# Patient Record
Sex: Female | Born: 1980 | Race: White | Hispanic: No | Marital: Single | Smoking: Current every day smoker
Health system: Southern US, Community
[De-identification: ages and names within clinical notes are randomized; demographics above are authoritative.]

## PROBLEM LIST (undated history)

## (undated) DIAGNOSIS — S82899A Other fracture of unspecified lower leg, initial encounter for closed fracture: Secondary | ICD-10-CM

## (undated) DIAGNOSIS — G43909 Migraine, unspecified, not intractable, without status migrainosus: Secondary | ICD-10-CM

## (undated) HISTORY — PX: ANKLE SURGERY: SHX546

## (undated) HISTORY — PX: MYRINGOTOMY: SUR874

## (undated) HISTORY — PX: WRIST SURGERY: SHX841

## (undated) HISTORY — PX: FRACTURE SURGERY: SHX138

## (undated) HISTORY — PX: CHOLECYSTECTOMY: SHX55

## (undated) HISTORY — PX: TUBAL LIGATION: SHX77

---

## 2003-02-19 ENCOUNTER — Ambulatory Visit (HOSPITAL_COMMUNITY): Admission: RE | Admit: 2003-02-19 | Discharge: 2003-02-19 | Payer: Self-pay | Admitting: *Deleted

## 2003-02-19 ENCOUNTER — Encounter: Payer: Self-pay | Admitting: *Deleted

## 2003-03-19 ENCOUNTER — Ambulatory Visit (HOSPITAL_COMMUNITY): Admission: RE | Admit: 2003-03-19 | Discharge: 2003-03-19 | Payer: Self-pay | Admitting: *Deleted

## 2003-03-19 ENCOUNTER — Encounter: Payer: Self-pay | Admitting: *Deleted

## 2003-05-05 ENCOUNTER — Encounter: Payer: Self-pay | Admitting: *Deleted

## 2003-05-05 ENCOUNTER — Ambulatory Visit (HOSPITAL_COMMUNITY): Admission: RE | Admit: 2003-05-05 | Discharge: 2003-05-05 | Payer: Self-pay | Admitting: *Deleted

## 2003-05-22 ENCOUNTER — Ambulatory Visit (HOSPITAL_COMMUNITY): Admission: AD | Admit: 2003-05-22 | Discharge: 2003-05-22 | Payer: Self-pay | Admitting: *Deleted

## 2003-05-26 ENCOUNTER — Ambulatory Visit (HOSPITAL_COMMUNITY): Admission: AD | Admit: 2003-05-26 | Discharge: 2003-05-26 | Payer: Self-pay | Admitting: *Deleted

## 2003-05-29 ENCOUNTER — Ambulatory Visit (HOSPITAL_COMMUNITY): Admission: AD | Admit: 2003-05-29 | Discharge: 2003-05-30 | Payer: Self-pay | Admitting: *Deleted

## 2003-05-29 ENCOUNTER — Ambulatory Visit (HOSPITAL_COMMUNITY): Admission: AD | Admit: 2003-05-29 | Discharge: 2003-05-29 | Payer: Self-pay | Admitting: *Deleted

## 2003-06-02 ENCOUNTER — Ambulatory Visit (HOSPITAL_COMMUNITY): Admission: AD | Admit: 2003-06-02 | Discharge: 2003-06-02 | Payer: Self-pay | Admitting: *Deleted

## 2003-06-05 ENCOUNTER — Ambulatory Visit (HOSPITAL_COMMUNITY): Admission: AD | Admit: 2003-06-05 | Discharge: 2003-06-05 | Payer: Self-pay | Admitting: *Deleted

## 2003-06-12 ENCOUNTER — Ambulatory Visit (HOSPITAL_COMMUNITY): Admission: AD | Admit: 2003-06-12 | Discharge: 2003-06-12 | Payer: Self-pay | Admitting: *Deleted

## 2003-06-16 ENCOUNTER — Ambulatory Visit (HOSPITAL_COMMUNITY): Admission: RE | Admit: 2003-06-16 | Discharge: 2003-06-16 | Payer: Self-pay | Admitting: *Deleted

## 2003-06-16 ENCOUNTER — Encounter: Payer: Self-pay | Admitting: *Deleted

## 2003-06-16 ENCOUNTER — Ambulatory Visit (HOSPITAL_COMMUNITY): Admission: AD | Admit: 2003-06-16 | Discharge: 2003-06-16 | Payer: Self-pay | Admitting: *Deleted

## 2003-06-19 ENCOUNTER — Ambulatory Visit (HOSPITAL_COMMUNITY): Admission: AD | Admit: 2003-06-19 | Discharge: 2003-06-19 | Payer: Self-pay | Admitting: *Deleted

## 2003-06-20 ENCOUNTER — Inpatient Hospital Stay (HOSPITAL_COMMUNITY): Admission: AD | Admit: 2003-06-20 | Discharge: 2003-06-23 | Payer: Self-pay | Admitting: *Deleted

## 2005-03-02 ENCOUNTER — Ambulatory Visit (HOSPITAL_COMMUNITY): Admission: RE | Admit: 2005-03-02 | Discharge: 2005-03-02 | Payer: Self-pay | Admitting: *Deleted

## 2005-05-11 ENCOUNTER — Ambulatory Visit (HOSPITAL_COMMUNITY): Admission: AD | Admit: 2005-05-11 | Discharge: 2005-05-11 | Payer: Self-pay | Admitting: *Deleted

## 2005-07-10 ENCOUNTER — Inpatient Hospital Stay (HOSPITAL_COMMUNITY): Admission: AD | Admit: 2005-07-10 | Discharge: 2005-07-12 | Payer: Self-pay | Admitting: *Deleted

## 2005-08-04 ENCOUNTER — Ambulatory Visit (HOSPITAL_COMMUNITY): Admission: RE | Admit: 2005-08-04 | Discharge: 2005-08-04 | Payer: Self-pay | Admitting: *Deleted

## 2010-12-30 ENCOUNTER — Emergency Department (HOSPITAL_COMMUNITY): Payer: Medicaid Other

## 2010-12-30 ENCOUNTER — Emergency Department (HOSPITAL_COMMUNITY)
Admission: EM | Admit: 2010-12-30 | Discharge: 2010-12-31 | Disposition: A | Discharge status: Home / Self Care | Payer: Medicaid Other | Attending: Emergency Medicine | Admitting: Emergency Medicine

## 2010-12-30 DIAGNOSIS — G8918 Other acute postprocedural pain: Secondary | ICD-10-CM | POA: Insufficient documentation

## 2011-09-01 ENCOUNTER — Encounter: Payer: Self-pay | Admitting: Oncology

## 2011-09-01 ENCOUNTER — Emergency Department (HOSPITAL_COMMUNITY)
Admission: EM | Admit: 2011-09-01 | Discharge: 2011-09-01 | Disposition: A | Discharge status: Home / Self Care | Payer: Medicaid Other | Attending: Emergency Medicine | Admitting: Emergency Medicine

## 2011-09-01 DIAGNOSIS — F17200 Nicotine dependence, unspecified, uncomplicated: Secondary | ICD-10-CM | POA: Insufficient documentation

## 2011-09-01 DIAGNOSIS — M545 Low back pain, unspecified: Secondary | ICD-10-CM | POA: Insufficient documentation

## 2011-09-01 HISTORY — DX: Other fracture of unspecified lower leg, initial encounter for closed fracture: S82.899A

## 2011-09-01 MED ORDER — HYDROMORPHONE HCL 2 MG/ML IJ SOLN
2.0000 mg | Freq: Once | INTRAMUSCULAR | Status: AC
  Administered 2011-09-01: 2 mg via INTRAMUSCULAR
  Filled 2011-09-01: qty 1

## 2011-09-01 MED ORDER — OXYCODONE-ACETAMINOPHEN 5-325 MG PO TABS: 2.0000 | ORAL_TABLET | ORAL | Status: AC | PRN

## 2011-09-01 NOTE — ED Notes (Signed)
MD at bedside. 

## 2011-09-01 NOTE — ED Provider Notes (Signed)
History     CSN: [id number] Arrival date & time: 09/01/2011  7:25 PM  Chief Complaint  Patient presents with  . Back Pain    (Consider location/radiation/quality/duration/timing/severity/associated sxs/prior treatment) HPI This 30 year old female has the gradual onset of constant lumbar pain without radiation or associated symptoms. Her pain is severe and burning. She has no pain down her legs with no weakness or numbness of her legs except for some chronic left foot weakness which has not changed for years. She has no change in bowel or bladder function. There is no trauma. She has no fever chest pain cough or shortness of breath. She has no abdominal pain or nausea or vomiting. She denies IV drug abuse. She has tried Tylenol and Motrin without relief at home. Any movement makes the pain a lot worse it's much better she states still. Past Medical History  Diagnosis Date  . Ankle fracture     Left.    Past Surgical History  Procedure Date  . Tubal ligation   . Cholecystectomy   . Myringotomy   . Ankle surgery     No family history on file.  History  Substance Use Topics  . Smoking status: Current Everyday Smoker  . Smokeless tobacco: Not on file  . Alcohol Use: No    OB History    Grav Para Term Preterm Abortions TAB SAB Ect Mult Living                  Review of Systems  Constitutional: Negative for fever.       10 Systems reviewed and are negative for acute change except as noted in the HPI.  HENT: Negative for congestion.   Eyes: Negative for discharge and redness.  Respiratory: Negative for cough and shortness of breath.   Cardiovascular: Negative for chest pain.  Gastrointestinal: Negative for vomiting, abdominal pain and diarrhea.  Genitourinary: Negative for dysuria, vaginal bleeding and vaginal discharge.  Musculoskeletal: Positive for back pain.  Skin: Negative for rash.  Neurological: Negative for syncope, numbness and headaches.    Psychiatric/Behavioral:       No behavior change.    Allergies  Review of patient's allergies indicates no known allergies.  Home Medications   Current Outpatient Rx  Name Route Sig Dispense Refill  . ALBUTEROL SULFATE HFA 108 (90 BASE) MCG/ACT IN AERS Inhalation Inhale 2 puffs into the lungs as needed. For acute asthma     . IBUPROFEN 200 MG PO TABS Oral Take 800 mg by mouth daily as needed. For pain     . OXYCODONE-ACETAMINOPHEN 5-325 MG PO TABS Oral Take 2 tablets by mouth every 4 (four) hours as needed for pain. 20 tablet 0    BP 136/94  Pulse 60  Temp(Src) 98.7 F (37.1 C) (Oral)  Resp 16  Ht 5\' 11"  (1.803 m)  Wt 195 lb (88.451 kg)  BMI 27.20 kg/m2  SpO2 100%  LMP 08/31/2011  Physical Exam  Nursing note and vitals reviewed. Constitutional:       Awake, alert, nontoxic appearance with baseline speech.  HENT:  Head: Atraumatic.  Eyes: Pupils are equal, round, and reactive to light. Right eye exhibits no discharge. Left eye exhibits no discharge.  Neck: Neck supple.  Cardiovascular: Normal rate and regular rhythm.   No murmur heard. Pulmonary/Chest: Effort normal and breath sounds normal. No respiratory distress. She has no wheezes. She has no rales. She exhibits no tenderness.  Abdominal: Soft. Bowel sounds are normal. She exhibits no mass.  There is no tenderness. There is no rebound.  Musculoskeletal: She exhibits no edema.       Thoracic back: She exhibits no tenderness.       Lumbar back: She exhibits no tenderness.       Bilateral lower extremities non tender without new rashes or color change, baseline ROM with intact DP pulses, CR<2 secs all digits bilaterally, sensation baseline light touch bilaterally for pt, DTR's symmetric and intact bilaterally KJ / AJ, motor symmetric bilateral 5 / 5 hip flexion, quadriceps, hamstrings  Right-sided EHL, foot dorsiflexion, foot plantarflexion is 5 out of 5 on the left side of these muscle groups is 4-5 at baseline and  unchanged today, gait is somewhat antalgic but without apparent new ataxia.  Back exam is diffusely tender over the midline lumbar and paralumbar regions without CVA tenderness  Neurological:       Mental status baseline for patient.  Upper extremity motor strength and sensation intact and symmetric bilaterally.  Skin: No rash noted.  Psychiatric: She has a normal mood and affect.    ED Course  Procedures (including critical care time)  Labs Reviewed - No data to display No results found.   1. Lumbar pain       MDM  I doubt any other EMC precluding discharge at this time including, but not necessarily limited to the following:cauda equina, SBI.        Hurman Horn, MD 09/05/11 2215

## 2011-09-01 NOTE — ED Notes (Signed)
Pt reports x 2 days ago she began with low back pain after waking; no known injury.  Pt denies radiation into LE.

## 2011-09-01 NOTE — ED Notes (Signed)
Lower back pain, pt denies injury. edp in room at this time.

## 2011-09-15 ENCOUNTER — Observation Stay (HOSPITAL_COMMUNITY)
Admission: EM | Admit: 2011-09-15 | Discharge: 2011-09-15 | DRG: 512 | Disposition: A | Discharge status: Home / Self Care | Payer: Medicaid Other | Attending: Orthopedic Surgery | Admitting: Orthopedic Surgery

## 2011-09-15 DIAGNOSIS — S52599A Other fractures of lower end of unspecified radius, initial encounter for closed fracture: Principal | ICD-10-CM | POA: Insufficient documentation

## 2011-09-15 DIAGNOSIS — G5600 Carpal tunnel syndrome, unspecified upper limb: Secondary | ICD-10-CM | POA: Insufficient documentation

## 2011-09-15 DIAGNOSIS — J45909 Unspecified asthma, uncomplicated: Secondary | ICD-10-CM | POA: Insufficient documentation

## 2011-09-15 DIAGNOSIS — F17200 Nicotine dependence, unspecified, uncomplicated: Secondary | ICD-10-CM | POA: Insufficient documentation
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
## 2011-09-21 NOTE — Op Note (Signed)
  [name], Nancy Mclean NO.:  [id number]  MEDICAL RECORD NO.:  [id number]  LOCATION:  2550                         FACILITY:  [facility]  PHYSICIAN:  Artist Pais. Tsuruko Murtha, M.D.DATE OF BIRTH:  Jun 13, 1981  DATE OF PROCEDURE:  09/15/2011 DATE OF DISCHARGE:  09/15/2011                              OPERATIVE REPORT   PREOPERATIVE DIAGNOSES: 1. Fracture dislocation, right wrist. 2. Right carpal tunnel syndrome.  POSTOPERATIVE DIAGNOSES: 1. Fracture dislocation, right wrist. 2. Right carpal tunnel syndrome.  PROCEDURE:  Open reduction and internal fixation of above with carpal tunnel release.  SURGEON:  Artist Pais. Mina Marble, MD  ASSISTANT:  None.  ANESTHESIA:  General.  TOURNIQUET TIME:  52 minutes.  COMPLICATIONS:  None.  DRAINS:  None.  The patient was taken to the operating suite after induction of general anesthesia.  Right upper extremity was prepped and draped in the usual sterile fashion.  An Esmarch was used to exsanguinate the limb. Tourniquet was inflated to 265 mmHg.  At this point in time, an incision was made starting at Kaplan's cardinal line going across the wrist crease in a Brunner fashion and extended out over the FCR to incorporate the carpal tunnel and the distal radius fracture.  Skin was incised. Dissection was carried down to skin and subcutaneous tissues.  The median nerve was identified at the proximal edge of the carpal tunnel. The carpal tunnel was released.  The median nerve was decompressed. There was significant blood and contusion about the median nerve.  It was decompressed proximally including release of the antecubital fascia. Once this was done, the interval between the FCR and radial artery was developed.  Dissection was carried down the level of pronator quadratus. There was a fracture dislocation of the wrist that was reduced with traction and flexion.  After this was done, the pronator quadratus was subperiosteally  stripped off the distal radius revealing a comminuted 4- 5 part intraarticular fracture of the distal radius.  One of the articular fragments had flipped 90 degrees.  This was carefully teased and eased back in position using pickups and a Therapist, nutritional.  Once this was done, a standard DVR plate was placed on the distal radius and using fluoroscopic guidance it was placed at its distal most extent to incorporate all the fracture fragments.  It was fixed with cortical screws proximally and smooth pegs distally.  Under again direct fluoroscopic guidance, intraoperative fluoroscopy revealed adequate reduction on AP, lateral, and oblique view.  After this was done, the wound was thoroughly irrigated.  The wound was closed in layers of 2-0 undyed Vicryl to reapproximate the pronator quadratus, 2-0 undyed Vicryl for the subcutaneous tissues, and 4-0 Vicryl Rapide on the skin.  Xeroform, 4 x 4's, fluffs, and a volar splint was applied.  The patient tolerated the procedure well and went to the recovery room in stable fashion.     Artist Pais Mina Marble, M.D.     MAW/MEDQ  D:  09/15/2011  T:  09/16/2011  Job:  [id number]  Electronically Signed by Dairl Ponder M.D. on 09/21/2011 04:27:12 PM

## 2012-08-27 ENCOUNTER — Emergency Department (HOSPITAL_COMMUNITY)
Admission: EM | Admit: 2012-08-27 | Discharge: 2012-08-27 | Disposition: A | Discharge status: Home / Self Care | Payer: Self-pay | Attending: Emergency Medicine | Admitting: Emergency Medicine

## 2012-08-27 ENCOUNTER — Encounter (HOSPITAL_COMMUNITY): Payer: Self-pay | Admitting: *Deleted

## 2012-08-27 DIAGNOSIS — R51 Headache: Secondary | ICD-10-CM | POA: Insufficient documentation

## 2012-08-27 DIAGNOSIS — F17200 Nicotine dependence, unspecified, uncomplicated: Secondary | ICD-10-CM | POA: Insufficient documentation

## 2012-08-27 HISTORY — DX: Migraine, unspecified, not intractable, without status migrainosus: G43.909

## 2012-08-27 MED ORDER — SODIUM CHLORIDE 0.9 % IV BOLUS (SEPSIS)
1000.0000 mL | Freq: Once | INTRAVENOUS | Status: AC
  Administered 2012-08-27: 1000 mL via INTRAVENOUS

## 2012-08-27 MED ORDER — METOCLOPRAMIDE HCL 5 MG/ML IJ SOLN
10.0000 mg | Freq: Once | INTRAMUSCULAR | Status: AC
  Administered 2012-08-27: 10 mg via INTRAVENOUS
  Filled 2012-08-27: qty 2

## 2012-08-27 MED ORDER — DIPHENHYDRAMINE HCL 50 MG/ML IJ SOLN
25.0000 mg | Freq: Once | INTRAMUSCULAR | Status: AC
  Administered 2012-08-27: 25 mg via INTRAVENOUS
  Filled 2012-08-27: qty 1

## 2012-08-27 MED ORDER — KETOROLAC TROMETHAMINE 30 MG/ML IJ SOLN
30.0000 mg | Freq: Once | INTRAMUSCULAR | Status: AC
  Administered 2012-08-27: 30 mg via INTRAVENOUS
  Filled 2012-08-27: qty 1

## 2012-08-27 NOTE — ED Notes (Addendum)
Family states that "she's about ready to yank out her IV and leave".  EDP notified that pt is ready to leave.

## 2012-08-27 NOTE — ED Notes (Signed)
Attempted to discharge and provide pt with discharge instructions.  Room empty, IV laying on bed, pt eloped. IV discontinued intact by pt.

## 2012-08-27 NOTE — ED Provider Notes (Signed)
History     CSN: [id number]  Arrival date & time 08/27/12  1622   First MD Initiated Contact with Patient 08/27/12 1852      Chief Complaint  Patient presents with  . Headache    (Consider location/radiation/quality/duration/timing/severity/associated sxs/prior treatment) HPI Comments: Patient presents with diffuse headache that she's had for the past 10-14 days. She describes the pain is typical of her migraines that she is not have an official diagnosis of migraines. She endorses nausea, vomiting, photophobia and phonophobia. No fevers. She taking Tylenol and ibuprofen without relief. She denies any visual changes. She denies thunderclap onset and states the pain is progressively worsened and became acutely worse today.  The history is provided by the patient.    Past Medical History  Diagnosis Date  . Ankle fracture     Left.  . Migraine     Past Surgical History  Procedure Date  . Tubal ligation   . Cholecystectomy   . Myringotomy   . Ankle surgery   . Wrist surgery     History reviewed. No pertinent family history.  History  Substance Use Topics  . Smoking status: Current Every Day Smoker  . Smokeless tobacco: Not on file  . Alcohol Use: No    OB History    Grav Para Term Preterm Abortions TAB SAB Ect Mult Living                  Review of Systems  Constitutional: Negative for fever.  HENT: Negative for congestion and rhinorrhea.   Eyes: Positive for photophobia. Negative for visual disturbance.  Respiratory: Negative for cough, chest tightness and shortness of breath.   Cardiovascular: Negative for chest pain.  Gastrointestinal: Negative for nausea, vomiting and abdominal pain.  Genitourinary: Negative for dysuria, hematuria, vaginal bleeding and vaginal discharge.  Musculoskeletal: Negative for back pain.  Skin: Negative for rash.  Neurological: Positive for headaches. Negative for dizziness and weakness.    Allergies  Review of patient's  allergies indicates no known allergies.  Home Medications   Current Outpatient Rx  Name Route Sig Dispense Refill  . ALBUTEROL SULFATE HFA 108 (90 BASE) MCG/ACT IN AERS Inhalation Inhale 2 puffs into the lungs as needed. For acute asthma     . IBUPROFEN 200 MG PO TABS Oral Take 800 mg by mouth daily as needed. For pain       BP 141/79  Pulse 58  Temp 98.2 F (36.8 C) (Oral)  Resp 18  Ht 5\' 11"  (1.803 m)  Wt 180 lb (81.647 kg)  BMI 25.10 kg/m2  SpO2 100%  LMP 07/21/2012  Physical Exam  Constitutional: She is oriented to person, place, and time. She appears well-developed. No distress.  HENT:  Head: Normocephalic and atraumatic.  Mouth/Throat: Oropharynx is clear and moist. No oropharyngeal exudate.       No appreciable papilledema  Eyes: Conjunctivae normal and EOM are normal. Pupils are equal, round, and reactive to light.  Neck: Normal range of motion. Neck supple.       No meningismus  Cardiovascular: Normal rate, regular rhythm and normal heart sounds.   No murmur heard. Pulmonary/Chest: Effort normal and breath sounds normal. No respiratory distress.  Abdominal: Soft. There is no tenderness. There is no rebound and no guarding.  Musculoskeletal: Normal range of motion. She exhibits no edema and no tenderness.  Neurological: She is alert and oriented to person, place, and time. No cranial nerve deficit.       5/5 strength  throughout, no ataxia on finger to nose, CN 2-12 intact.  Skin: Skin is warm.    ED Course  Procedures (including critical care time)  Labs Reviewed - No data to display No results found.   No diagnosis found.    MDM  Gradual onset headache similar to previous migraines associated with nausea, vomiting, photophobia.  Denies thunderclap onset. No deficits.  Headache improved with the cocktail. No red flags on exam. She is feeling better and requesting discharge.      Glynn Octave, MD 08/27/12 2126

## 2012-08-27 NOTE — ED Notes (Signed)
Pt reporting some improvement in headache.  No distress noted. Ambulated well independently.

## 2012-08-27 NOTE — ED Notes (Signed)
Headache for 1 week, n/v,  No HI, alert,

## 2013-09-29 ENCOUNTER — Emergency Department (HOSPITAL_COMMUNITY): Payer: Self-pay

## 2013-09-29 ENCOUNTER — Encounter (HOSPITAL_COMMUNITY): Payer: Self-pay | Admitting: Emergency Medicine

## 2013-09-29 ENCOUNTER — Emergency Department (HOSPITAL_COMMUNITY)
Admission: EM | Admit: 2013-09-29 | Discharge: 2013-09-29 | Disposition: A | Discharge status: Home / Self Care | Payer: Self-pay | Attending: Emergency Medicine | Admitting: Emergency Medicine

## 2013-09-29 DIAGNOSIS — Z8679 Personal history of other diseases of the circulatory system: Secondary | ICD-10-CM | POA: Insufficient documentation

## 2013-09-29 DIAGNOSIS — J45901 Unspecified asthma with (acute) exacerbation: Secondary | ICD-10-CM | POA: Insufficient documentation

## 2013-09-29 DIAGNOSIS — Z79899 Other long term (current) drug therapy: Secondary | ICD-10-CM | POA: Insufficient documentation

## 2013-09-29 DIAGNOSIS — J40 Bronchitis, not specified as acute or chronic: Secondary | ICD-10-CM

## 2013-09-29 DIAGNOSIS — F17200 Nicotine dependence, unspecified, uncomplicated: Secondary | ICD-10-CM | POA: Insufficient documentation

## 2013-09-29 DIAGNOSIS — J3489 Other specified disorders of nose and nasal sinuses: Secondary | ICD-10-CM | POA: Insufficient documentation

## 2013-09-29 DIAGNOSIS — Z8781 Personal history of (healed) traumatic fracture: Secondary | ICD-10-CM | POA: Insufficient documentation

## 2013-09-29 DIAGNOSIS — R1110 Vomiting, unspecified: Secondary | ICD-10-CM | POA: Insufficient documentation

## 2013-09-29 DIAGNOSIS — R6883 Chills (without fever): Secondary | ICD-10-CM | POA: Insufficient documentation

## 2013-09-29 LAB — BASIC METABOLIC PANEL
BUN: 12 mg/dL (ref 6–23)
CO2: 23 mEq/L (ref 19–32)
Chloride: 100 mEq/L (ref 96–112)
GFR calc non Af Amer: 82 mL/min — ABNORMAL LOW (ref >90)
Glucose, Bld: 93 mg/dL (ref 70–99)
Potassium: 3.6 mEq/L (ref 3.5–5.1)

## 2013-09-29 LAB — CBC WITH DIFFERENTIAL/PLATELET
Eosinophils Absolute: 0.1 10*3/uL (ref 0.0–0.7)
Hemoglobin: 15.1 g/dL — ABNORMAL HIGH (ref 12.0–15.0)
Lymphocytes Relative: 22 % (ref 12–46)
Lymphs Abs: 2.4 10*3/uL (ref 0.7–4.0)
MCH: 32.3 pg (ref 26.0–34.0)
Monocytes Relative: 7 % (ref 3–12)
Neutrophils Relative %: 70 % (ref 43–77)
RBC: 4.67 MIL/uL (ref 3.87–5.11)

## 2013-09-29 MED ORDER — DM-GUAIFENESIN ER 30-600 MG PO TB12: 1.0000 | ORAL_TABLET | Freq: Two times a day (BID) | ORAL | Status: AC

## 2013-09-29 MED ORDER — ALBUTEROL SULFATE HFA 108 (90 BASE) MCG/ACT IN AERS: 2.0000 | INHALATION_SPRAY | RESPIRATORY_TRACT | Status: AC | PRN

## 2013-09-29 MED ORDER — DM-GUAIFENESIN ER 30-600 MG PO TB12
1.0000 | ORAL_TABLET | Freq: Two times a day (BID) | ORAL | Status: DC
  Administered 2013-09-29: 1 via ORAL
  Filled 2013-09-29: qty 1

## 2013-09-29 MED ORDER — AEROCHAMBER PLUS FLO-VU MEDIUM MISC
1.0000 | Freq: Once | Status: AC
  Administered 2013-09-29: 1
  Filled 2013-09-29: qty 1

## 2013-09-29 MED ORDER — DOXYCYCLINE HYCLATE 100 MG PO TABS: 100.0000 mg | ORAL_TABLET | Freq: Two times a day (BID) | ORAL | Status: AC

## 2013-09-29 NOTE — ED Notes (Signed)
Sick with uri sx for last few days, fever yesterday Today feels weak , vomiting

## 2013-09-29 NOTE — ED Notes (Signed)
Pt alert & oriented x4. Patient given discharge instructions, paperwork & prescription(s). Patient verbalized understanding. Pt left department w/ no further questions.  

## 2013-09-29 NOTE — ED Provider Notes (Signed)
CSN: [id number]     Arrival date & time 09/29/13  1725 History   First MD Initiated Contact with Patient 09/29/13 1846     This chart was scribed for Ward Givens, MD by Arlan Organ, ED Scribe. This patient was seen in room APA02/APA02 and the patient's care was started 7:12 PM.   Chief Complaint  Patient presents with  . URI   The history is provided by the patient. No language interpreter was used.   HPI Comments: Nancy Mclean is a 32 y.o. Female with a hx of asthma who presents to the Emergency Department complaining of URI symptoms  that started 3 days ago.  She states it started with chest congestion and a productive cough consisting of yellow sputum. She also reports rhinorrhea with yellow discharge, chest tightness, SOB, chills, and emesis. Pt states she had 2 episodes of emesis today, and 3 episodes yesterday. She says she had a fever of 102.8 yesterday but has now been resolved. She denies chest pain but hs chronic diarrhea since she had her cholecystectomy done. States she feels weak. States she did eat yesterday but not today.    She denies ever being admitted for her asthma, but reports being put on prednisone. Pt is currently a smoker, and smokes about 1 pack a day. She currently works at [employer].   Pt states she does not go to the doctor unless she is extremely sick.  PCP none  Past Medical History  Diagnosis Date  . Ankle fracture     Left.  . Migraine    Past Surgical History  Procedure Laterality Date  . Tubal ligation    . Cholecystectomy    . Myringotomy    . Ankle surgery    . Wrist surgery    . Fracture surgery     History reviewed. No pertinent family history. History  Substance Use Topics  . Smoking status: Current Every Day Smoker  . Smokeless tobacco: Not on file  . Alcohol Use: No   Employed Smokes 1 ppd  OB History   Grav Para Term Preterm Abortions TAB SAB Ect Mult Living                 Review of Systems  Constitutional: Positive  for chills.  HENT: Positive for rhinorrhea.   Respiratory: Positive for cough and chest tightness.   All other systems reviewed and are negative.    Allergies  Review of patient's allergies indicates no known allergies.  Home Medications   Current Outpatient Rx  Name  Route  Sig  Dispense  Refill  . albuterol (PROVENTIL HFA;VENTOLIN HFA) 108 (90 BASE) MCG/ACT inhaler   Inhalation   Inhale 2 puffs into the lungs as needed. For acute asthma          . ibuprofen (ADVIL,MOTRIN) 200 MG tablet   Oral   Take 800 mg by mouth daily as needed. For pain          Triage Vitals: BP 129/70  Pulse 76  Temp(Src) 98.1 F (36.7 C) (Oral)  Resp 20  Ht 5\' 11"  (1.803 m)  Wt 190 lb (86.183 kg)  BMI 26.51 kg/m2  SpO2 98%  LMP 09/20/2013  Vital signs normal    Physical Exam  Nursing note and vitals reviewed. Constitutional: She is oriented to person, place, and time. She appears well-developed and well-nourished.  Non-toxic appearance. She does not appear ill. No distress.  Hot to touch  HENT:  Head: Normocephalic and  atraumatic.  Right Ear: External ear normal.  Left Ear: External ear normal.  Nose: Nose normal. No mucosal edema or rhinorrhea.  Mouth/Throat: Oropharynx is clear and moist and mucous membranes are normal. No dental abscesses or uvula swelling.  Eyes: Conjunctivae and EOM are normal. Pupils are equal, round, and reactive to light.  Neck: Normal range of motion and full passive range of motion without pain. Neck supple.  Cardiovascular: Normal rate, regular rhythm and normal heart sounds.  Exam reveals no gallop and no friction rub.   No murmur heard. Pulmonary/Chest: Effort normal and breath sounds normal. No respiratory distress. She has no wheezes. She has no rhonchi. She has no rales. She exhibits no tenderness and no crepitus.  Abdominal: Soft. Normal appearance and bowel sounds are normal. She exhibits no distension. There is no tenderness. There is no rebound and  no guarding.  Musculoskeletal: Normal range of motion. She exhibits no edema and no tenderness.  Moves all extremities well.   Neurological: She is alert and oriented to person, place, and time. She has normal strength. No cranial nerve deficit.  Skin: Skin is warm, dry and intact. No rash noted. No erythema. No pallor.  Psychiatric: She has a normal mood and affect. Her speech is normal and behavior is normal. Her mood appears not anxious.    ED Course  Procedures (including critical care time) Medications  dextromethorphan-guaiFENesin (MUCINEX DM) 30-600 MG per 12 hr tablet 1 tablet (1 tablet Oral Given 09/29/13 1919)    DIAGNOSTIC STUDIES: Oxygen Saturation is 98% on RA, Normal by my interpretation.    COORDINATION OF CARE: 7:15 PM- Will give mucinex. Will order CXR. Discussed treatment plan with pt at bedside and pt agreed to plan.     8:27 PM- Discussed lab results with pt. She states Mucinex DM relieved her cough and some of her chest tightness.  Labs Review Results for orders placed during the hospital encounter of 09/29/13  CBC WITH DIFFERENTIAL      Result Value Range   WBC 10.8 (*) 4.0 - 10.5 K/uL   RBC 4.67  3.87 - 5.11 MIL/uL   Hemoglobin 15.1 (*) 12.0 - 15.0 g/dL   HCT 16.1  09.6 - 04.5 %   MCV 95.3  78.0 - 100.0 fL   MCH 32.3  26.0 - 34.0 pg   MCHC 33.9  30.0 - 36.0 g/dL   RDW 40.9  81.1 - 91.4 %   Platelets 223  150 - 400 K/uL   Neutrophils Relative % 70  43 - 77 %   Neutro Abs 7.6  1.7 - 7.7 K/uL   Lymphocytes Relative 22  12 - 46 %   Lymphs Abs 2.4  0.7 - 4.0 K/uL   Monocytes Relative 7  3 - 12 %   Monocytes Absolute 0.7  0.1 - 1.0 K/uL   Eosinophils Relative 1  0 - 5 %   Eosinophils Absolute 0.1  0.0 - 0.7 K/uL   Basophils Relative 0  0 - 1 %   Basophils Absolute 0.0  0.0 - 0.1 K/uL  BASIC METABOLIC PANEL      Result Value Range   Sodium 134 (*) 135 - 145 mEq/L   Potassium 3.6  3.5 - 5.1 mEq/L   Chloride 100  96 - 112 mEq/L   CO2 23  19 - 32 mEq/L    Glucose, Bld 93  70 - 99 mg/dL   BUN 12  6 - 23 mg/dL   Creatinine, Ser  0.92  0.50 - 1.10 mg/dL   Calcium 9.5  8.4 - 45.4 mg/dL   GFR calc non Af Amer 82 (*) >90 mL/min   GFR calc Af Amer >90  >90 mL/min   Laboratory interpretation all normal except mild leukocytosis   Imaging Review Dg Chest 2 View  09/29/2013   CLINICAL DATA:  Cough, fever.  EXAM: CHEST  2 VIEW  COMPARISON:  None.  FINDINGS: Mild peribronchial thickening. Heart and mediastinal contours are within normal limits. No focal opacities or effusions. No acute bony abnormality.  IMPRESSION: Bronchitic changes.   Electronically Signed   By: Charlett Nose M.D.   On: 09/29/2013 19:50    EKG Interpretation   None       MDM   1. Bronchitis    Discharge Medication List as of 09/29/2013  8:29 PM    START taking these medications   Details  !! albuterol (PROVENTIL HFA;VENTOLIN HFA) 108 (90 BASE) MCG/ACT inhaler Inhale 2 puffs into the lungs every 4 (four) hours as needed for wheezing or shortness of breath., Starting 09/29/2013, Until Discontinued, Print    dextromethorphan-guaiFENesin (MUCINEX DM) 30-600 MG per 12 hr tablet Take 1 tablet by mouth 2 (two) times daily., Starting 09/29/2013, Until Discontinued, Print    doxycycline (VIBRA-TABS) 100 MG tablet Take 1 tablet (100 mg total) by mouth 2 (two) times daily., Starting 09/29/2013, Until Discontinued, Print     !! - Potential duplicate medications found. Please discuss with provider.       Plan discharge   I personally performed the services described in this documentation, which was scribed in my presence. The recorded information has been reviewed and considered.  Devoria Albe, MD, [facility]   Ward Givens, MD 09/29/13 2139

## 2014-01-17 ENCOUNTER — Encounter (HOSPITAL_COMMUNITY): Payer: Self-pay | Admitting: Emergency Medicine

## 2014-01-17 ENCOUNTER — Emergency Department (HOSPITAL_COMMUNITY)
Admission: EM | Admit: 2014-01-17 | Discharge: 2014-01-17 | Disposition: A | Discharge status: Home / Self Care | Payer: Medicaid Other | Attending: Emergency Medicine | Admitting: Emergency Medicine

## 2014-01-17 DIAGNOSIS — Z792 Long term (current) use of antibiotics: Secondary | ICD-10-CM | POA: Insufficient documentation

## 2014-01-17 DIAGNOSIS — Z8781 Personal history of (healed) traumatic fracture: Secondary | ICD-10-CM | POA: Insufficient documentation

## 2014-01-17 DIAGNOSIS — Z8679 Personal history of other diseases of the circulatory system: Secondary | ICD-10-CM | POA: Insufficient documentation

## 2014-01-17 DIAGNOSIS — M549 Dorsalgia, unspecified: Secondary | ICD-10-CM

## 2014-01-17 DIAGNOSIS — Z79899 Other long term (current) drug therapy: Secondary | ICD-10-CM | POA: Insufficient documentation

## 2014-01-17 DIAGNOSIS — M546 Pain in thoracic spine: Secondary | ICD-10-CM | POA: Insufficient documentation

## 2014-01-17 DIAGNOSIS — F17200 Nicotine dependence, unspecified, uncomplicated: Secondary | ICD-10-CM | POA: Insufficient documentation

## 2014-01-17 MED ORDER — OXYCODONE-ACETAMINOPHEN 5-325 MG PO TABS
1.0000 | ORAL_TABLET | Freq: Once | ORAL | Status: AC
  Administered 2014-01-17: 1 via ORAL
  Filled 2014-01-17: qty 1

## 2014-01-17 MED ORDER — CYCLOBENZAPRINE HCL 10 MG PO TABS: 10.0000 mg | ORAL_TABLET | Freq: Three times a day (TID) | ORAL | Status: AC

## 2014-01-17 MED ORDER — IBUPROFEN 800 MG PO TABS
800.0000 mg | ORAL_TABLET | Freq: Once | ORAL | Status: DC
  Filled 2014-01-17: qty 1

## 2014-01-17 MED ORDER — IBUPROFEN 800 MG PO TABS: 800.0000 mg | ORAL_TABLET | Freq: Three times a day (TID) | ORAL | Status: AC

## 2014-01-17 MED ORDER — CYCLOBENZAPRINE HCL 10 MG PO TABS
10.0000 mg | ORAL_TABLET | Freq: Once | ORAL | Status: AC
  Administered 2014-01-17: 10 mg via ORAL
  Filled 2014-01-17: qty 1

## 2014-01-17 MED ORDER — OXYCODONE-ACETAMINOPHEN 5-325 MG PO TABS: 1.0000 | ORAL_TABLET | ORAL | Status: AC | PRN

## 2014-01-17 NOTE — ED Notes (Signed)
Entire back pain x 3 days has been progressive.  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  Difficulty bending, twisting, stretching.  Has tried hot pack, baths, cold packs, ibuprofen and tylenol and Tramadol which hasn't helped.  Pain at 9/10.

## 2014-01-17 NOTE — ED Provider Notes (Signed)
Medical screening examination/treatment/procedure(s) were performed by non-physician practitioner and as supervising physician I was immediately available for consultation/collaboration.   EKG Interpretation None        Cadarius Nevares L Izik Bingman, MD 01/17/14 1947 

## 2014-01-17 NOTE — ED Provider Notes (Signed)
CSN: [id number]     Arrival date & time 01/17/14  1856 History   First MD Initiated Contact with Patient 01/17/14 1923     Chief Complaint  Patient presents with  . Back Pain     (Consider location/radiation/quality/duration/timing/severity/associated sxs/prior Treatment) Patient is a 33 y.o. female presenting with back pain. The history is provided by the patient and the spouse. No language interpreter was used.  Back Pain Location:  Thoracic spine Quality:  Shooting and stabbing Radiates to:  Does not radiate Associated symptoms: no fever   Associated symptoms comment:  Back pain for the past 3 days, progressively worsening. No known injury. Worse with movement, better with rest. No SOB, chest or abdominal pain. No fever, cough, N, V.   Past Medical History  Diagnosis Date  . Ankle fracture     Left.  . Migraine    Past Surgical History  Procedure Laterality Date  . Tubal ligation    . Cholecystectomy    . Myringotomy    . Ankle surgery    . Wrist surgery    . Fracture surgery     History reviewed. No pertinent family history. History  Substance Use Topics  . Smoking status: Current Every Day Smoker -- 0.50 packs/day    Types: Cigarettes  . Smokeless tobacco: Not on file  . Alcohol Use: No   OB History   Grav Para Term Preterm Abortions TAB SAB Ect Mult Living                 Review of Systems  Constitutional: Negative for fever and chills.  HENT: Negative.   Respiratory: Negative.   Cardiovascular: Negative.   Gastrointestinal: Negative.   Musculoskeletal: Positive for back pain.       See HPI.  Skin: Negative.   Neurological: Negative.       Allergies  Review of patient's allergies indicates no known allergies.  Home Medications   Current Outpatient Rx  Name  Route  Sig  Dispense  Refill  . acetaminophen (TYLENOL) 500 MG tablet   Oral   Take 1,000 mg by mouth every 6 (six) hours as needed.         Marland Kitchen. albuterol (PROVENTIL HFA;VENTOLIN HFA)  108 (90 BASE) MCG/ACT inhaler   Inhalation   Inhale 2 puffs into the lungs as needed. For acute asthma         . albuterol (PROVENTIL HFA;VENTOLIN HFA) 108 (90 BASE) MCG/ACT inhaler   Inhalation   Inhale 2 puffs into the lungs every 4 (four) hours as needed for wheezing or shortness of breath.   3.7 g   0   . dextromethorphan-guaiFENesin (MUCINEX DM) 30-600 MG per 12 hr tablet   Oral   Take 1 tablet by mouth 2 (two) times daily.   20 tablet   0   . doxycycline (VIBRA-TABS) 100 MG tablet   Oral   Take 1 tablet (100 mg total) by mouth 2 (two) times daily.   20 tablet   0   . ibuprofen (ADVIL,MOTRIN) 200 MG tablet   Oral   Take 800 mg by mouth daily as needed. For pain         . Multiple Vitamin (MULTIVITAMIN WITH MINERALS) TABS tablet   Oral   Take 1 tablet by mouth daily.          BP 145/98  Pulse 86  Temp(Src) 98.2 F (36.8 C) (Oral)  Resp 14  Ht 5\' 11"  (1.803 m)  Wt 195  lb (88.451 kg)  BMI 27.21 kg/m2  SpO2 100%  LMP 01/15/2014 Physical Exam  Constitutional: She is oriented to person, place, and time. She appears well-developed and well-nourished.  Neck: Normal range of motion.  Pulmonary/Chest: Effort normal. She exhibits no tenderness.  Abdominal: There is no tenderness.  Musculoskeletal:  Pain with movement. No swelling of back, no discoloration. Generalized tenderness. No midline cervical or paracervical tenderness. FROM LE's and UE's.   Neurological: She is alert and oriented to person, place, and time.  Skin: Skin is warm and dry.    ED Course  Procedures (including critical care time) Labs Review Labs Reviewed - No data to display Imaging Review No results found.   EKG Interpretation None      MDM   Final diagnoses:  None    1. Back pain  Uncomplicated muscular back pain. Will treat symptomatically and give ortho referral if symptoms persist or worsen.      Arnoldo Hooker, PA-C 01/17/14 1942

## 2014-01-17 NOTE — ED Notes (Signed)
Gave pt instructions in body mechanics, ice therapy, rest and f/u. Encouraged pt to increase fluid intake. Pt given work note until 3/3

## 2014-01-17 NOTE — Discharge Instructions (Signed)
Back Exercises °Back exercises help treat and prevent back injuries. The goal of back exercises is to increase the strength of your abdominal and back muscles and the flexibility of your back. These exercises should be started when you no longer have back pain. Back exercises include: °· Pelvic Tilt. Lie on your back with your knees bent. Tilt your pelvis until the lower part of your back is against the floor. Hold this position 5 to 10 sec and repeat 5 to 10 times. °· Knee to Chest. Pull first 1 knee up against your chest and hold for 20 to 30 seconds, repeat this with the other knee, and then both knees. This may be done with the other leg straight or bent, whichever feels better. °· Sit-Ups or Curl-Ups. Bend your knees 90 degrees. Start with tilting your pelvis, and do a partial, slow sit-up, lifting your trunk only 30 to 45 degrees off the floor. Take at least 2 to 3 seconds for each sit-up. Do not do sit-ups with your knees out straight. If partial sit-ups are difficult, simply do the above but with only tightening your abdominal muscles and holding it as directed. °· Hip-Lift. Lie on your back with your knees flexed 90 degrees. Push down with your feet and shoulders as you raise your hips a couple inches off the floor; hold for 10 seconds, repeat 5 to 10 times. °· Back arches. Lie on your stomach, propping yourself up on bent elbows. Slowly press on your hands, causing an arch in your low back. Repeat 3 to 5 times. Any initial stiffness and discomfort should lessen with repetition over time. °· Shoulder-Lifts. Lie face down with arms beside your body. Keep hips and torso pressed to floor as you slowly lift your head and shoulders off the floor. °Do not overdo your exercises, especially in the beginning. Exercises may cause you some mild back discomfort which lasts for a few minutes; however, if the pain is more severe, or lasts for more than 15 minutes, do not continue exercises until you see your caregiver.  Improvement with exercise therapy for back problems is slow.  °See your caregivers for assistance with developing a proper back exercise program. °Document Released: 12/14/2004 Document Revised: 01/29/2012 Document Reviewed: 09/07/2011 °[employer]® Patient Information ©2014 [employer]. ° °Back Pain, Adult °Low back pain is very common. About 1 in 5 people have back pain. The cause of low back pain is rarely dangerous. The pain often gets better over time. About half of people with a sudden onset of back pain feel better in just 2 weeks. About 8 in 10 people feel better by 6 weeks.  °CAUSES °Some common causes of back pain include: °· Strain of the muscles or ligaments supporting the spine. °· Wear and tear (degeneration) of the spinal discs. °· Arthritis. °· Direct injury to the back. °DIAGNOSIS °Most of the time, the direct cause of low back pain is not known. However, back pain can be treated effectively even when the exact cause of the pain is unknown. Answering your caregiver's questions about your overall health and symptoms is one of the most accurate ways to make sure the cause of your pain is not dangerous. If your caregiver needs more information, he or she may order lab work or imaging tests (X-rays or MRIs). However, even if imaging tests show changes in your back, this usually does not require surgery. °HOME CARE INSTRUCTIONS °For many people, back pain returns. Since low back pain is rarely dangerous, it is often a condition that people   can learn to manage on their own.  °· Remain active. It is stressful on the back to sit or stand in one place. Do not sit, drive, or stand in one place for more than 30 minutes at a time. Take short walks on level surfaces as soon as pain allows. Try to increase the length of time you walk each day. °· Do not stay in bed. Resting more than 1 or 2 days can delay your recovery. °· Do not avoid exercise or work. Your body is made to move. It is not dangerous to be active,  even though your back may hurt. Your back will likely heal faster if you return to being active before your pain is gone. °· Pay attention to your body when you  bend and lift. Many people have less discomfort when lifting if they bend their knees, keep the load close to their bodies, and avoid twisting. Often, the most comfortable positions are those that put less stress on your recovering back. °· Find a comfortable position to sleep. Use a firm mattress and lie on your side with your knees slightly bent. If you lie on your back, put a pillow under your knees. °· Only take over-the-counter or prescription medicines as directed by your caregiver. Over-the-counter medicines to reduce pain and inflammation are often the most helpful. Your caregiver may prescribe muscle relaxant drugs. These medicines help dull your pain so you can more quickly return to your normal activities and healthy exercise. °· Put ice on the injured area. °· Put ice in a plastic bag. °· Place a towel between your skin and the bag. °· Leave the ice on for 15-20 minutes, 03-04 times a day for the first 2 to 3 days. After that, ice and heat may be alternated to reduce pain and spasms. °· Ask your caregiver about trying back exercises and gentle massage. This may be of some benefit. °· Avoid feeling anxious or stressed. Stress increases muscle tension and can worsen back pain. It is important to recognize when you are anxious or stressed and learn ways to manage it. Exercise is a great option. °SEEK MEDICAL CARE IF: °· You have pain that is not relieved with rest or medicine. °· You have pain that does not improve in 1 week. °· You have new symptoms. °· You are generally not feeling well. °SEEK IMMEDIATE MEDICAL CARE IF:  °· You have pain that radiates from your back into your legs. °· You develop new bowel or bladder control problems. °· You have unusual weakness or numbness in your arms or legs. °· You develop nausea or vomiting. °· You develop  abdominal pain. °· You feel faint. °Document Released: 11/06/2005 Document Revised: 05/07/2012 Document Reviewed: 03/27/2011 °[employer]® Patient Information ©2014 [employer]. ° °

## 2014-05-22 IMAGING — CR DG CHEST 2V
2 series · 2 of 2 positions shown · non-contrast
Comparison: None.

CLINICAL DATA: Cough, fever.

EXAM:
CHEST  2 VIEW

[view not recorded (1 of 2)]
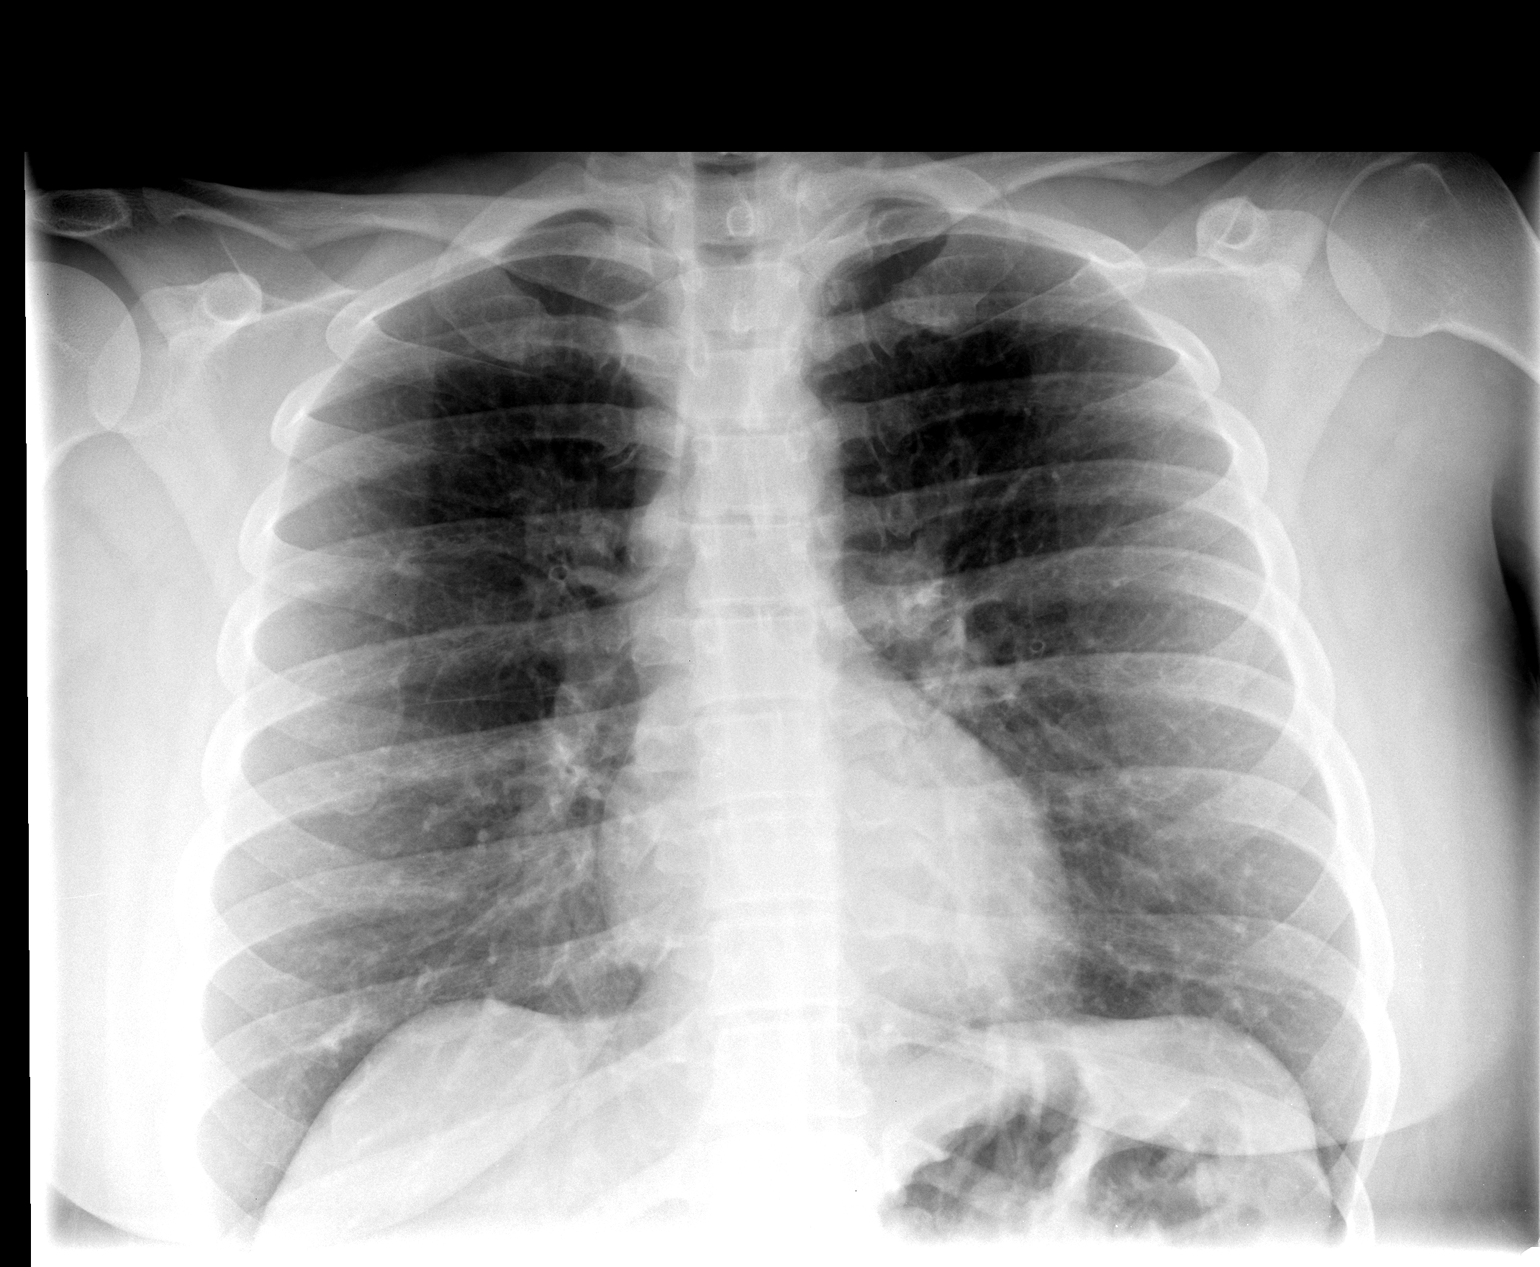

[view not recorded (2 of 2)]
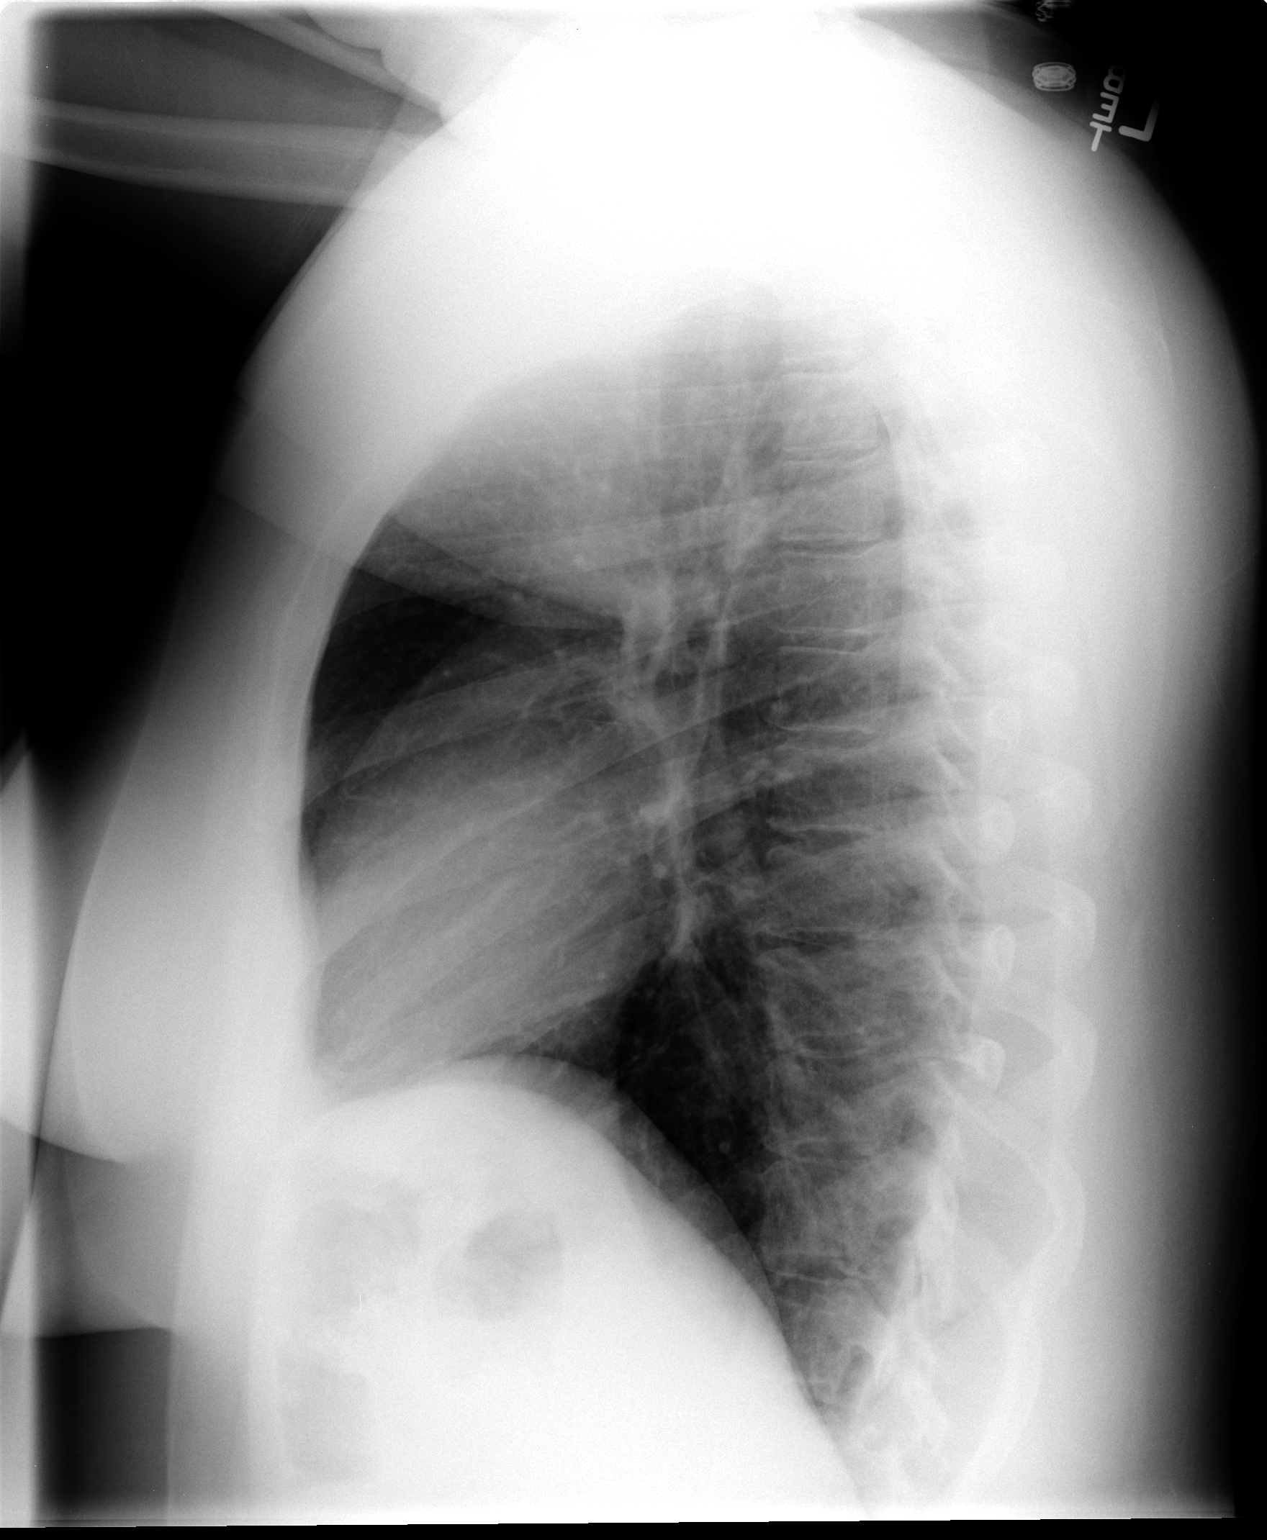

[2 of 2 positions shown; findings below may reference images not displayed]

FINDINGS: Mild peribronchial thickening. Heart and mediastinal contours are
within normal limits. No focal opacities or effusions. No acute bony
abnormality.
IMPRESSION: Bronchitic changes.

## 2024-03-20 DEATH — deceased
